# Patient Record
Sex: Female | Born: 1980 | Race: White | Hispanic: No | Marital: Married | Smoking: Never smoker
Health system: Southern US, Community
[De-identification: ages and names within clinical notes are randomized; demographics above are authoritative.]

## PROBLEM LIST (undated history)

## (undated) DIAGNOSIS — T7840XA Allergy, unspecified, initial encounter: Secondary | ICD-10-CM

## (undated) HISTORY — DX: Allergy, unspecified, initial encounter: T78.40XA

---

## 2018-09-25 DIAGNOSIS — Z6827 Body mass index (BMI) 27.0-27.9, adult: Secondary | ICD-10-CM | POA: Diagnosis not present

## 2018-09-25 DIAGNOSIS — Z0000 Encounter for general adult medical examination without abnormal findings: Secondary | ICD-10-CM | POA: Diagnosis not present

## 2018-09-25 DIAGNOSIS — Z13220 Encounter for screening for lipoid disorders: Secondary | ICD-10-CM | POA: Diagnosis not present

## 2018-09-25 DIAGNOSIS — J302 Other seasonal allergic rhinitis: Secondary | ICD-10-CM | POA: Diagnosis not present

## 2018-09-25 DIAGNOSIS — Z23 Encounter for immunization: Secondary | ICD-10-CM | POA: Diagnosis not present

## 2019-05-09 DIAGNOSIS — H5213 Myopia, bilateral: Secondary | ICD-10-CM | POA: Diagnosis not present

## 2019-10-24 DIAGNOSIS — Z13220 Encounter for screening for lipoid disorders: Secondary | ICD-10-CM | POA: Diagnosis not present

## 2019-10-24 DIAGNOSIS — J302 Other seasonal allergic rhinitis: Secondary | ICD-10-CM | POA: Diagnosis not present

## 2019-10-24 DIAGNOSIS — Z0000 Encounter for general adult medical examination without abnormal findings: Secondary | ICD-10-CM | POA: Diagnosis not present

## 2019-10-24 DIAGNOSIS — Z23 Encounter for immunization: Secondary | ICD-10-CM | POA: Diagnosis not present

## 2019-10-24 DIAGNOSIS — Z136 Encounter for screening for cardiovascular disorders: Secondary | ICD-10-CM | POA: Diagnosis not present

## 2020-01-30 DIAGNOSIS — L2084 Intrinsic (allergic) eczema: Secondary | ICD-10-CM | POA: Diagnosis not present

## 2020-01-30 DIAGNOSIS — L309 Dermatitis, unspecified: Secondary | ICD-10-CM | POA: Diagnosis not present

## 2020-01-30 DIAGNOSIS — L811 Chloasma: Secondary | ICD-10-CM | POA: Diagnosis not present

## 2020-02-05 DIAGNOSIS — R5383 Other fatigue: Secondary | ICD-10-CM | POA: Diagnosis not present

## 2020-02-05 DIAGNOSIS — K2970 Gastritis, unspecified, without bleeding: Secondary | ICD-10-CM | POA: Diagnosis not present

## 2020-02-29 DIAGNOSIS — Z23 Encounter for immunization: Secondary | ICD-10-CM | POA: Diagnosis not present

## 2020-03-26 DIAGNOSIS — Z23 Encounter for immunization: Secondary | ICD-10-CM | POA: Diagnosis not present

## 2020-04-22 DIAGNOSIS — M545 Low back pain: Secondary | ICD-10-CM | POA: Diagnosis not present

## 2020-04-25 ENCOUNTER — Ambulatory Visit (INDEPENDENT_AMBULATORY_CARE_PROVIDER_SITE_OTHER): Payer: BLUE CROSS/BLUE SHIELD

## 2020-04-25 ENCOUNTER — Other Ambulatory Visit: Payer: Self-pay

## 2020-04-25 ENCOUNTER — Ambulatory Visit: Payer: BLUE CROSS/BLUE SHIELD | Admitting: Sports Medicine

## 2020-04-25 ENCOUNTER — Encounter: Payer: Self-pay | Admitting: Sports Medicine

## 2020-04-25 DIAGNOSIS — M545 Low back pain, unspecified: Secondary | ICD-10-CM | POA: Insufficient documentation

## 2020-04-25 DIAGNOSIS — G8929 Other chronic pain: Secondary | ICD-10-CM

## 2020-04-25 MED ORDER — CELECOXIB 200 MG PO CAPS: ORAL_CAPSULE | ORAL | 2 refills | Status: AC

## 2020-04-25 NOTE — Progress Notes (Signed)
    Procedures performed today:    None.  Independent interpretation of notes and tests performed by another provider:   None.  Brief History, Exam, Impression, and Recommendations:    Chronic low back pain This is a very pleasant 39 year old female, she has 6 children. She has had chronic history of low back pain, right worse than left localized at the sacroiliac joint, worse with standing. Her exam is for the most part benign, only minimal tenderness over the right sacroiliac joint. We are going to start with aggressive formal physical therapy, Celebrex, x-rays. I like to see her back in about 6 weeks, we will do SI joint injections if no better, we discussed other treatments including RFA and fusion.    ___________________________________________ Ihor Austin. Benjamin Stain, M.D., ABFM., CAQSM. Primary Care and Sports Medicine [facility]  Adjunct Instructor of Family Medicine  [employer]

## 2020-04-25 NOTE — Assessment & Plan Note (Signed)
This is a very pleasant 39 year old female, she has 6 children. She has had chronic history of low back pain, right worse than left localized at the sacroiliac joint, worse with standing. Her exam is for the most part benign, only minimal tenderness over the right sacroiliac joint. We are going to start with aggressive formal physical therapy, Celebrex, x-rays. I like to see her back in about 6 weeks, we will do SI joint injections if no better, we discussed other treatments including RFA and fusion.

## 2020-05-12 DIAGNOSIS — M461 Sacroiliitis, not elsewhere classified: Secondary | ICD-10-CM | POA: Diagnosis not present

## 2020-05-12 DIAGNOSIS — M6281 Muscle weakness (generalized): Secondary | ICD-10-CM | POA: Diagnosis not present

## 2020-05-22 DIAGNOSIS — M461 Sacroiliitis, not elsewhere classified: Secondary | ICD-10-CM | POA: Diagnosis not present

## 2020-05-22 DIAGNOSIS — M6281 Muscle weakness (generalized): Secondary | ICD-10-CM | POA: Diagnosis not present

## 2020-05-26 DIAGNOSIS — M6281 Muscle weakness (generalized): Secondary | ICD-10-CM | POA: Diagnosis not present

## 2020-05-26 DIAGNOSIS — M461 Sacroiliitis, not elsewhere classified: Secondary | ICD-10-CM | POA: Diagnosis not present

## 2020-05-28 DIAGNOSIS — H5213 Myopia, bilateral: Secondary | ICD-10-CM | POA: Diagnosis not present

## 2020-05-29 DIAGNOSIS — M461 Sacroiliitis, not elsewhere classified: Secondary | ICD-10-CM | POA: Diagnosis not present

## 2020-05-29 DIAGNOSIS — M6281 Muscle weakness (generalized): Secondary | ICD-10-CM | POA: Diagnosis not present

## 2020-06-04 DIAGNOSIS — M6281 Muscle weakness (generalized): Secondary | ICD-10-CM | POA: Diagnosis not present

## 2020-06-04 DIAGNOSIS — M461 Sacroiliitis, not elsewhere classified: Secondary | ICD-10-CM | POA: Diagnosis not present

## 2020-06-09 ENCOUNTER — Other Ambulatory Visit: Payer: Self-pay

## 2020-06-09 ENCOUNTER — Ambulatory Visit (INDEPENDENT_AMBULATORY_CARE_PROVIDER_SITE_OTHER): Payer: BLUE CROSS/BLUE SHIELD | Admitting: Sports Medicine

## 2020-06-09 ENCOUNTER — Encounter: Payer: Self-pay | Admitting: Sports Medicine

## 2020-06-09 DIAGNOSIS — M545 Low back pain: Secondary | ICD-10-CM | POA: Diagnosis not present

## 2020-06-09 DIAGNOSIS — M461 Sacroiliitis, not elsewhere classified: Secondary | ICD-10-CM | POA: Diagnosis not present

## 2020-06-09 DIAGNOSIS — G8929 Other chronic pain: Secondary | ICD-10-CM

## 2020-06-09 DIAGNOSIS — M6281 Muscle weakness (generalized): Secondary | ICD-10-CM | POA: Diagnosis not present

## 2020-06-09 NOTE — Progress Notes (Signed)
    Procedures performed today:    None.  Independent interpretation of notes and tests performed by another provider:   None.  Brief History, Exam, Impression, and Recommendations:    Chronic low back pain This is a very pleasant 39-year-old female, multiparous, has a history of chronic low back pain, right worse than left localized at the SI joint, at the last visit we started Celebrex and added aggressive formal physical therapy. She returns today doing significantly better and continuing to improve. We are going to set an open-ended follow-up,    ___________________________________________ Ihor Austin. Benjamin Stain, M.D., ABFM., CAQSM. Primary Care and Sports Medicine [facility]  Adjunct Instructor of Family Medicine  [employer]

## 2020-06-09 NOTE — Assessment & Plan Note (Signed)
This is a very pleasant 39-year-old female, multiparous, has a history of chronic low back pain, right worse than left localized at the SI joint, at the last visit we started Celebrex and added aggressive formal physical therapy. She returns today doing significantly better and continuing to improve. We are going to set an open-ended follow-up,

## 2020-06-16 DIAGNOSIS — M6281 Muscle weakness (generalized): Secondary | ICD-10-CM | POA: Diagnosis not present

## 2020-06-16 DIAGNOSIS — M461 Sacroiliitis, not elsewhere classified: Secondary | ICD-10-CM | POA: Diagnosis not present

## 2020-06-25 DIAGNOSIS — M6281 Muscle weakness (generalized): Secondary | ICD-10-CM | POA: Diagnosis not present

## 2020-06-25 DIAGNOSIS — M461 Sacroiliitis, not elsewhere classified: Secondary | ICD-10-CM | POA: Diagnosis not present

## 2020-07-04 DIAGNOSIS — M6281 Muscle weakness (generalized): Secondary | ICD-10-CM | POA: Diagnosis not present

## 2020-07-04 DIAGNOSIS — M461 Sacroiliitis, not elsewhere classified: Secondary | ICD-10-CM | POA: Diagnosis not present

## 2020-07-09 DIAGNOSIS — M461 Sacroiliitis, not elsewhere classified: Secondary | ICD-10-CM | POA: Diagnosis not present

## 2020-07-09 DIAGNOSIS — M6281 Muscle weakness (generalized): Secondary | ICD-10-CM | POA: Diagnosis not present

## 2020-07-17 DIAGNOSIS — M461 Sacroiliitis, not elsewhere classified: Secondary | ICD-10-CM | POA: Diagnosis not present

## 2020-07-17 DIAGNOSIS — M6281 Muscle weakness (generalized): Secondary | ICD-10-CM | POA: Diagnosis not present

## 2020-07-20 DIAGNOSIS — Z20828 Contact with and (suspected) exposure to other viral communicable diseases: Secondary | ICD-10-CM | POA: Diagnosis not present

## 2020-09-30 DIAGNOSIS — Z1231 Encounter for screening mammogram for malignant neoplasm of breast: Secondary | ICD-10-CM | POA: Diagnosis not present

## 2020-11-12 DIAGNOSIS — Z131 Encounter for screening for diabetes mellitus: Secondary | ICD-10-CM | POA: Diagnosis not present

## 2020-11-12 DIAGNOSIS — Z124 Encounter for screening for malignant neoplasm of cervix: Secondary | ICD-10-CM | POA: Diagnosis not present

## 2020-11-12 DIAGNOSIS — Z0000 Encounter for general adult medical examination without abnormal findings: Secondary | ICD-10-CM | POA: Diagnosis not present

## 2020-11-12 DIAGNOSIS — H93A2 Pulsatile tinnitus, left ear: Secondary | ICD-10-CM | POA: Diagnosis not present

## 2020-11-12 DIAGNOSIS — Z13220 Encounter for screening for lipoid disorders: Secondary | ICD-10-CM | POA: Diagnosis not present

## 2020-11-12 DIAGNOSIS — R7989 Other specified abnormal findings of blood chemistry: Secondary | ICD-10-CM | POA: Diagnosis not present

## 2020-11-12 DIAGNOSIS — F331 Major depressive disorder, recurrent, moderate: Secondary | ICD-10-CM | POA: Diagnosis not present

## 2020-11-12 DIAGNOSIS — Z23 Encounter for immunization: Secondary | ICD-10-CM | POA: Diagnosis not present

## 2020-11-27 DIAGNOSIS — Z23 Encounter for immunization: Secondary | ICD-10-CM | POA: Diagnosis not present

## 2020-12-08 DIAGNOSIS — H903 Sensorineural hearing loss, bilateral: Secondary | ICD-10-CM | POA: Diagnosis not present

## 2020-12-08 DIAGNOSIS — H93A2 Pulsatile tinnitus, left ear: Secondary | ICD-10-CM | POA: Diagnosis not present

## 2021-01-08 DIAGNOSIS — F331 Major depressive disorder, recurrent, moderate: Secondary | ICD-10-CM | POA: Diagnosis not present

## 2021-01-08 DIAGNOSIS — N3941 Urge incontinence: Secondary | ICD-10-CM | POA: Diagnosis not present

## 2021-01-26 DIAGNOSIS — M25562 Pain in left knee: Secondary | ICD-10-CM | POA: Diagnosis not present

## 2021-01-26 DIAGNOSIS — M25462 Effusion, left knee: Secondary | ICD-10-CM | POA: Diagnosis not present

## 2021-01-26 DIAGNOSIS — M25461 Effusion, right knee: Secondary | ICD-10-CM | POA: Diagnosis not present

## 2021-01-26 DIAGNOSIS — M25561 Pain in right knee: Secondary | ICD-10-CM | POA: Diagnosis not present

## 2021-01-29 DIAGNOSIS — S83412A Sprain of medial collateral ligament of left knee, initial encounter: Secondary | ICD-10-CM | POA: Diagnosis not present

## 2021-01-29 DIAGNOSIS — S83522A Sprain of posterior cruciate ligament of left knee, initial encounter: Secondary | ICD-10-CM | POA: Diagnosis not present

## 2021-01-29 DIAGNOSIS — S8002XA Contusion of left knee, initial encounter: Secondary | ICD-10-CM | POA: Diagnosis not present

## 2021-01-29 DIAGNOSIS — S83512A Sprain of anterior cruciate ligament of left knee, initial encounter: Secondary | ICD-10-CM | POA: Diagnosis not present

## 2021-02-03 DIAGNOSIS — S83512D Sprain of anterior cruciate ligament of left knee, subsequent encounter: Secondary | ICD-10-CM | POA: Diagnosis not present

## 2021-02-03 DIAGNOSIS — S83242D Other tear of medial meniscus, current injury, left knee, subsequent encounter: Secondary | ICD-10-CM | POA: Diagnosis not present

## 2021-02-03 DIAGNOSIS — S83412D Sprain of medial collateral ligament of left knee, subsequent encounter: Secondary | ICD-10-CM | POA: Diagnosis not present

## 2021-02-04 DIAGNOSIS — M6281 Muscle weakness (generalized): Secondary | ICD-10-CM | POA: Diagnosis not present

## 2021-02-04 DIAGNOSIS — M25562 Pain in left knee: Secondary | ICD-10-CM | POA: Diagnosis not present

## 2021-02-04 DIAGNOSIS — R262 Difficulty in walking, not elsewhere classified: Secondary | ICD-10-CM | POA: Diagnosis not present

## 2021-02-06 DIAGNOSIS — R262 Difficulty in walking, not elsewhere classified: Secondary | ICD-10-CM | POA: Diagnosis not present

## 2021-02-06 DIAGNOSIS — M25562 Pain in left knee: Secondary | ICD-10-CM | POA: Diagnosis not present

## 2021-02-06 DIAGNOSIS — M6281 Muscle weakness (generalized): Secondary | ICD-10-CM | POA: Diagnosis not present

## 2021-02-09 DIAGNOSIS — M25562 Pain in left knee: Secondary | ICD-10-CM | POA: Diagnosis not present

## 2021-02-09 DIAGNOSIS — R262 Difficulty in walking, not elsewhere classified: Secondary | ICD-10-CM | POA: Diagnosis not present

## 2021-02-09 DIAGNOSIS — M6281 Muscle weakness (generalized): Secondary | ICD-10-CM | POA: Diagnosis not present

## 2021-02-10 DIAGNOSIS — S8992XS Unspecified injury of left lower leg, sequela: Secondary | ICD-10-CM | POA: Diagnosis not present

## 2021-02-10 DIAGNOSIS — S83512A Sprain of anterior cruciate ligament of left knee, initial encounter: Secondary | ICD-10-CM | POA: Diagnosis not present

## 2021-02-10 DIAGNOSIS — S83412A Sprain of medial collateral ligament of left knee, initial encounter: Secondary | ICD-10-CM | POA: Diagnosis not present

## 2021-02-10 DIAGNOSIS — S83522A Sprain of posterior cruciate ligament of left knee, initial encounter: Secondary | ICD-10-CM | POA: Diagnosis not present

## 2021-02-18 DIAGNOSIS — M25562 Pain in left knee: Secondary | ICD-10-CM | POA: Diagnosis not present

## 2021-02-18 DIAGNOSIS — M6281 Muscle weakness (generalized): Secondary | ICD-10-CM | POA: Diagnosis not present

## 2021-02-18 DIAGNOSIS — R262 Difficulty in walking, not elsewhere classified: Secondary | ICD-10-CM | POA: Diagnosis not present

## 2021-02-24 DIAGNOSIS — M25562 Pain in left knee: Secondary | ICD-10-CM | POA: Diagnosis not present

## 2021-02-24 DIAGNOSIS — M6281 Muscle weakness (generalized): Secondary | ICD-10-CM | POA: Diagnosis not present

## 2021-02-24 DIAGNOSIS — R262 Difficulty in walking, not elsewhere classified: Secondary | ICD-10-CM | POA: Diagnosis not present

## 2021-03-04 DIAGNOSIS — M25562 Pain in left knee: Secondary | ICD-10-CM | POA: Diagnosis not present

## 2021-03-04 DIAGNOSIS — M6281 Muscle weakness (generalized): Secondary | ICD-10-CM | POA: Diagnosis not present

## 2021-03-04 DIAGNOSIS — R262 Difficulty in walking, not elsewhere classified: Secondary | ICD-10-CM | POA: Diagnosis not present

## 2021-03-13 DIAGNOSIS — M6281 Muscle weakness (generalized): Secondary | ICD-10-CM | POA: Diagnosis not present

## 2021-03-13 DIAGNOSIS — M25562 Pain in left knee: Secondary | ICD-10-CM | POA: Diagnosis not present

## 2021-03-13 DIAGNOSIS — R262 Difficulty in walking, not elsewhere classified: Secondary | ICD-10-CM | POA: Diagnosis not present

## 2021-03-16 DIAGNOSIS — M94262 Chondromalacia, left knee: Secondary | ICD-10-CM | POA: Diagnosis not present

## 2021-03-16 DIAGNOSIS — S83522A Sprain of posterior cruciate ligament of left knee, initial encounter: Secondary | ICD-10-CM | POA: Diagnosis not present

## 2021-03-16 DIAGNOSIS — S8992XD Unspecified injury of left lower leg, subsequent encounter: Secondary | ICD-10-CM | POA: Diagnosis not present

## 2021-03-16 DIAGNOSIS — G8918 Other acute postprocedural pain: Secondary | ICD-10-CM | POA: Diagnosis not present

## 2021-03-16 DIAGNOSIS — S83412A Sprain of medial collateral ligament of left knee, initial encounter: Secondary | ICD-10-CM | POA: Diagnosis not present

## 2021-03-16 DIAGNOSIS — S83512A Sprain of anterior cruciate ligament of left knee, initial encounter: Secondary | ICD-10-CM | POA: Diagnosis not present

## 2021-03-18 DIAGNOSIS — R262 Difficulty in walking, not elsewhere classified: Secondary | ICD-10-CM | POA: Diagnosis not present

## 2021-03-18 DIAGNOSIS — M6281 Muscle weakness (generalized): Secondary | ICD-10-CM | POA: Diagnosis not present

## 2021-03-18 DIAGNOSIS — M25562 Pain in left knee: Secondary | ICD-10-CM | POA: Diagnosis not present

## 2021-03-24 DIAGNOSIS — M25562 Pain in left knee: Secondary | ICD-10-CM | POA: Diagnosis not present

## 2021-03-24 DIAGNOSIS — R262 Difficulty in walking, not elsewhere classified: Secondary | ICD-10-CM | POA: Diagnosis not present

## 2021-03-24 DIAGNOSIS — M6281 Muscle weakness (generalized): Secondary | ICD-10-CM | POA: Diagnosis not present

## 2021-03-31 DIAGNOSIS — R262 Difficulty in walking, not elsewhere classified: Secondary | ICD-10-CM | POA: Diagnosis not present

## 2021-03-31 DIAGNOSIS — M6281 Muscle weakness (generalized): Secondary | ICD-10-CM | POA: Diagnosis not present

## 2021-03-31 DIAGNOSIS — M25562 Pain in left knee: Secondary | ICD-10-CM | POA: Diagnosis not present

## 2021-04-06 DIAGNOSIS — R262 Difficulty in walking, not elsewhere classified: Secondary | ICD-10-CM | POA: Diagnosis not present

## 2021-04-06 DIAGNOSIS — M25562 Pain in left knee: Secondary | ICD-10-CM | POA: Diagnosis not present

## 2021-04-06 DIAGNOSIS — M6281 Muscle weakness (generalized): Secondary | ICD-10-CM | POA: Diagnosis not present

## 2021-04-09 DIAGNOSIS — M6281 Muscle weakness (generalized): Secondary | ICD-10-CM | POA: Diagnosis not present

## 2021-04-09 DIAGNOSIS — M25562 Pain in left knee: Secondary | ICD-10-CM | POA: Diagnosis not present

## 2021-04-09 DIAGNOSIS — R262 Difficulty in walking, not elsewhere classified: Secondary | ICD-10-CM | POA: Diagnosis not present

## 2021-04-13 DIAGNOSIS — R262 Difficulty in walking, not elsewhere classified: Secondary | ICD-10-CM | POA: Diagnosis not present

## 2021-04-13 DIAGNOSIS — M25562 Pain in left knee: Secondary | ICD-10-CM | POA: Diagnosis not present

## 2021-04-13 DIAGNOSIS — M6281 Muscle weakness (generalized): Secondary | ICD-10-CM | POA: Diagnosis not present

## 2021-04-15 DIAGNOSIS — M25562 Pain in left knee: Secondary | ICD-10-CM | POA: Diagnosis not present

## 2021-04-15 DIAGNOSIS — R262 Difficulty in walking, not elsewhere classified: Secondary | ICD-10-CM | POA: Diagnosis not present

## 2021-04-15 DIAGNOSIS — M6281 Muscle weakness (generalized): Secondary | ICD-10-CM | POA: Diagnosis not present

## 2021-04-17 DIAGNOSIS — M25462 Effusion, left knee: Secondary | ICD-10-CM | POA: Diagnosis not present

## 2021-04-17 DIAGNOSIS — Z9889 Other specified postprocedural states: Secondary | ICD-10-CM | POA: Diagnosis not present

## 2021-04-20 DIAGNOSIS — M6281 Muscle weakness (generalized): Secondary | ICD-10-CM | POA: Diagnosis not present

## 2021-04-20 DIAGNOSIS — M25562 Pain in left knee: Secondary | ICD-10-CM | POA: Diagnosis not present

## 2021-04-20 DIAGNOSIS — R262 Difficulty in walking, not elsewhere classified: Secondary | ICD-10-CM | POA: Diagnosis not present

## 2021-04-22 DIAGNOSIS — M6281 Muscle weakness (generalized): Secondary | ICD-10-CM | POA: Diagnosis not present

## 2021-04-22 DIAGNOSIS — M25562 Pain in left knee: Secondary | ICD-10-CM | POA: Diagnosis not present

## 2021-04-22 DIAGNOSIS — R262 Difficulty in walking, not elsewhere classified: Secondary | ICD-10-CM | POA: Diagnosis not present

## 2021-04-27 DIAGNOSIS — R262 Difficulty in walking, not elsewhere classified: Secondary | ICD-10-CM | POA: Diagnosis not present

## 2021-04-27 DIAGNOSIS — M25562 Pain in left knee: Secondary | ICD-10-CM | POA: Diagnosis not present

## 2021-04-27 DIAGNOSIS — M6281 Muscle weakness (generalized): Secondary | ICD-10-CM | POA: Diagnosis not present

## 2021-04-29 DIAGNOSIS — M6281 Muscle weakness (generalized): Secondary | ICD-10-CM | POA: Diagnosis not present

## 2021-04-29 DIAGNOSIS — R262 Difficulty in walking, not elsewhere classified: Secondary | ICD-10-CM | POA: Diagnosis not present

## 2021-04-29 DIAGNOSIS — M25562 Pain in left knee: Secondary | ICD-10-CM | POA: Diagnosis not present

## 2021-05-04 DIAGNOSIS — R262 Difficulty in walking, not elsewhere classified: Secondary | ICD-10-CM | POA: Diagnosis not present

## 2021-05-04 DIAGNOSIS — M25562 Pain in left knee: Secondary | ICD-10-CM | POA: Diagnosis not present

## 2021-05-04 DIAGNOSIS — M6281 Muscle weakness (generalized): Secondary | ICD-10-CM | POA: Diagnosis not present

## 2021-05-06 DIAGNOSIS — M6281 Muscle weakness (generalized): Secondary | ICD-10-CM | POA: Diagnosis not present

## 2021-05-06 DIAGNOSIS — M25562 Pain in left knee: Secondary | ICD-10-CM | POA: Diagnosis not present

## 2021-05-06 DIAGNOSIS — R262 Difficulty in walking, not elsewhere classified: Secondary | ICD-10-CM | POA: Diagnosis not present

## 2021-05-11 DIAGNOSIS — M6281 Muscle weakness (generalized): Secondary | ICD-10-CM | POA: Diagnosis not present

## 2021-05-11 DIAGNOSIS — M25562 Pain in left knee: Secondary | ICD-10-CM | POA: Diagnosis not present

## 2021-05-11 DIAGNOSIS — R262 Difficulty in walking, not elsewhere classified: Secondary | ICD-10-CM | POA: Diagnosis not present

## 2021-05-13 DIAGNOSIS — M25562 Pain in left knee: Secondary | ICD-10-CM | POA: Diagnosis not present

## 2021-05-13 DIAGNOSIS — R262 Difficulty in walking, not elsewhere classified: Secondary | ICD-10-CM | POA: Diagnosis not present

## 2021-05-13 DIAGNOSIS — M6281 Muscle weakness (generalized): Secondary | ICD-10-CM | POA: Diagnosis not present

## 2021-05-15 DIAGNOSIS — M238X2 Other internal derangements of left knee: Secondary | ICD-10-CM | POA: Diagnosis not present

## 2021-05-15 DIAGNOSIS — M23612 Other spontaneous disruption of anterior cruciate ligament of left knee: Secondary | ICD-10-CM | POA: Diagnosis not present

## 2021-05-15 DIAGNOSIS — M23622 Other spontaneous disruption of posterior cruciate ligament of left knee: Secondary | ICD-10-CM | POA: Diagnosis not present

## 2021-05-15 DIAGNOSIS — S8992XA Unspecified injury of left lower leg, initial encounter: Secondary | ICD-10-CM | POA: Diagnosis not present

## 2021-05-19 DIAGNOSIS — R262 Difficulty in walking, not elsewhere classified: Secondary | ICD-10-CM | POA: Diagnosis not present

## 2021-05-19 DIAGNOSIS — M25562 Pain in left knee: Secondary | ICD-10-CM | POA: Diagnosis not present

## 2021-05-19 DIAGNOSIS — M6281 Muscle weakness (generalized): Secondary | ICD-10-CM | POA: Diagnosis not present

## 2021-05-22 DIAGNOSIS — M6281 Muscle weakness (generalized): Secondary | ICD-10-CM | POA: Diagnosis not present

## 2021-05-22 DIAGNOSIS — R262 Difficulty in walking, not elsewhere classified: Secondary | ICD-10-CM | POA: Diagnosis not present

## 2021-05-22 DIAGNOSIS — M25562 Pain in left knee: Secondary | ICD-10-CM | POA: Diagnosis not present

## 2021-05-25 DIAGNOSIS — R262 Difficulty in walking, not elsewhere classified: Secondary | ICD-10-CM | POA: Diagnosis not present

## 2021-05-25 DIAGNOSIS — M25562 Pain in left knee: Secondary | ICD-10-CM | POA: Diagnosis not present

## 2021-05-25 DIAGNOSIS — M6281 Muscle weakness (generalized): Secondary | ICD-10-CM | POA: Diagnosis not present

## 2021-05-28 DIAGNOSIS — M25562 Pain in left knee: Secondary | ICD-10-CM | POA: Diagnosis not present

## 2021-05-28 DIAGNOSIS — M6281 Muscle weakness (generalized): Secondary | ICD-10-CM | POA: Diagnosis not present

## 2021-05-28 DIAGNOSIS — R262 Difficulty in walking, not elsewhere classified: Secondary | ICD-10-CM | POA: Diagnosis not present

## 2021-06-01 DIAGNOSIS — M6281 Muscle weakness (generalized): Secondary | ICD-10-CM | POA: Diagnosis not present

## 2021-06-01 DIAGNOSIS — M25562 Pain in left knee: Secondary | ICD-10-CM | POA: Diagnosis not present

## 2021-06-01 DIAGNOSIS — R262 Difficulty in walking, not elsewhere classified: Secondary | ICD-10-CM | POA: Diagnosis not present

## 2021-06-03 DIAGNOSIS — R262 Difficulty in walking, not elsewhere classified: Secondary | ICD-10-CM | POA: Diagnosis not present

## 2021-06-03 DIAGNOSIS — M6281 Muscle weakness (generalized): Secondary | ICD-10-CM | POA: Diagnosis not present

## 2021-06-03 DIAGNOSIS — M25562 Pain in left knee: Secondary | ICD-10-CM | POA: Diagnosis not present

## 2021-06-16 DIAGNOSIS — S83522S Sprain of posterior cruciate ligament of left knee, sequela: Secondary | ICD-10-CM | POA: Diagnosis not present

## 2021-06-16 DIAGNOSIS — M24662 Ankylosis, left knee: Secondary | ICD-10-CM | POA: Diagnosis not present

## 2021-06-30 DIAGNOSIS — H5213 Myopia, bilateral: Secondary | ICD-10-CM | POA: Diagnosis not present

## 2021-07-07 DIAGNOSIS — M24662 Ankylosis, left knee: Secondary | ICD-10-CM | POA: Diagnosis not present

## 2021-07-07 DIAGNOSIS — M25562 Pain in left knee: Secondary | ICD-10-CM | POA: Diagnosis not present

## 2021-07-08 DIAGNOSIS — F3281 Premenstrual dysphoric disorder: Secondary | ICD-10-CM | POA: Diagnosis not present

## 2021-07-08 DIAGNOSIS — R140 Abdominal distension (gaseous): Secondary | ICD-10-CM | POA: Diagnosis not present

## 2021-07-21 DIAGNOSIS — Z9889 Other specified postprocedural states: Secondary | ICD-10-CM | POA: Diagnosis not present

## 2021-07-21 DIAGNOSIS — M24662 Ankylosis, left knee: Secondary | ICD-10-CM | POA: Diagnosis not present

## 2021-08-20 DIAGNOSIS — S83412D Sprain of medial collateral ligament of left knee, subsequent encounter: Secondary | ICD-10-CM | POA: Diagnosis not present

## 2021-10-23 DIAGNOSIS — H9203 Otalgia, bilateral: Secondary | ICD-10-CM | POA: Diagnosis not present

## 2021-10-30 DIAGNOSIS — J019 Acute sinusitis, unspecified: Secondary | ICD-10-CM | POA: Diagnosis not present

## 2021-10-30 DIAGNOSIS — H6982 Other specified disorders of Eustachian tube, left ear: Secondary | ICD-10-CM | POA: Diagnosis not present

## 2021-10-30 DIAGNOSIS — B9689 Other specified bacterial agents as the cause of diseases classified elsewhere: Secondary | ICD-10-CM | POA: Diagnosis not present

## 2021-11-06 DIAGNOSIS — H93293 Other abnormal auditory perceptions, bilateral: Secondary | ICD-10-CM | POA: Diagnosis not present

## 2021-11-06 DIAGNOSIS — H8110 Benign paroxysmal vertigo, unspecified ear: Secondary | ICD-10-CM | POA: Diagnosis not present

## 2021-11-06 DIAGNOSIS — H93A2 Pulsatile tinnitus, left ear: Secondary | ICD-10-CM | POA: Diagnosis not present

## 2021-11-16 DIAGNOSIS — H93A9 Pulsatile tinnitus, unspecified ear: Secondary | ICD-10-CM | POA: Diagnosis not present

## 2021-11-16 DIAGNOSIS — H93A2 Pulsatile tinnitus, left ear: Secondary | ICD-10-CM | POA: Diagnosis not present

## 2021-11-18 DIAGNOSIS — Z1231 Encounter for screening mammogram for malignant neoplasm of breast: Secondary | ICD-10-CM | POA: Diagnosis not present

## 2022-02-21 IMAGING — DX DG LUMBAR SPINE COMPLETE 4+V
5 series · 5 of 5 positions shown · non-contrast
Comparison: None.

CLINICAL DATA: Chronic low back and right leg pain.

EXAM:
LUMBAR SPINE - COMPLETE 4+ VIEW

[l-spine ap]
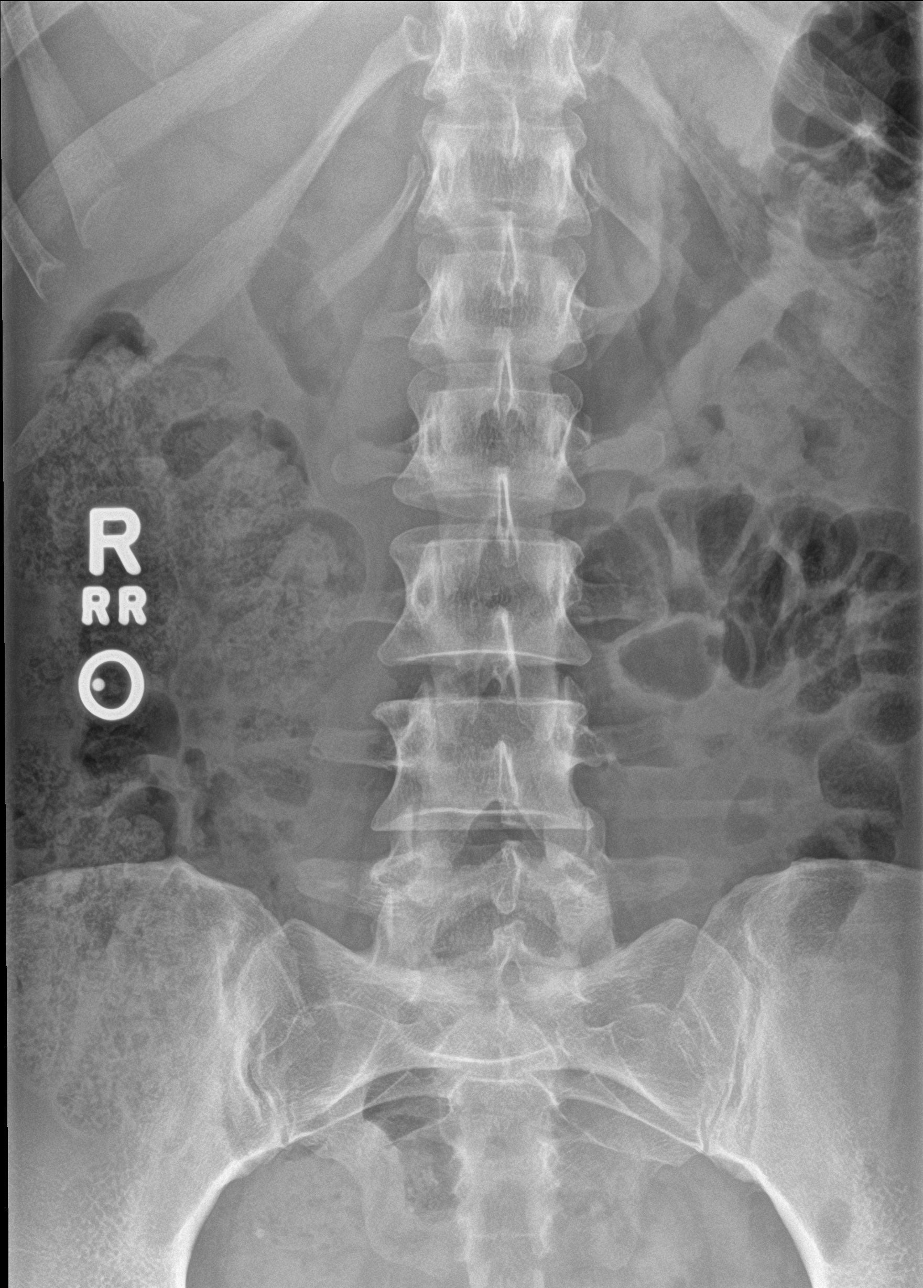

[l-spine obl (1 of 2)]
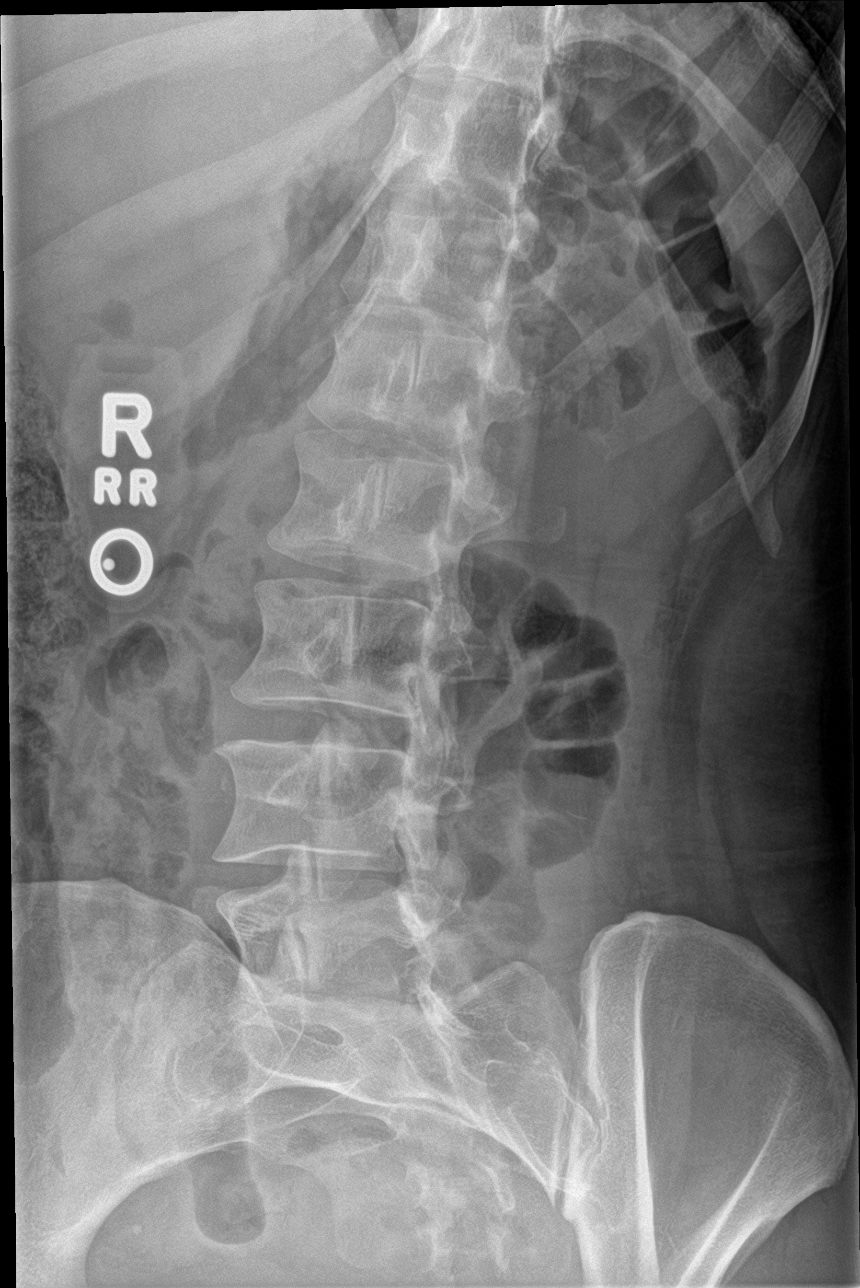

[l-spine obl (2 of 2)]
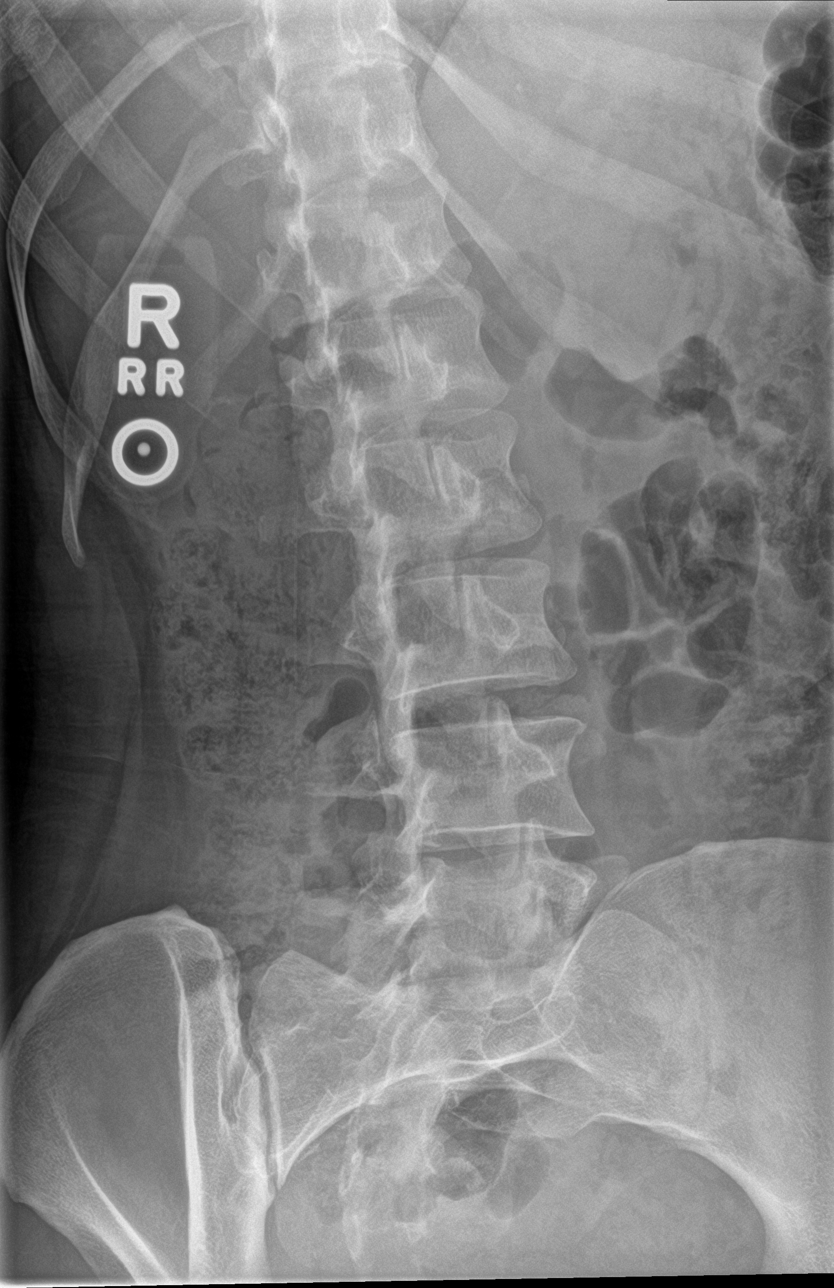

[l-spine lat]
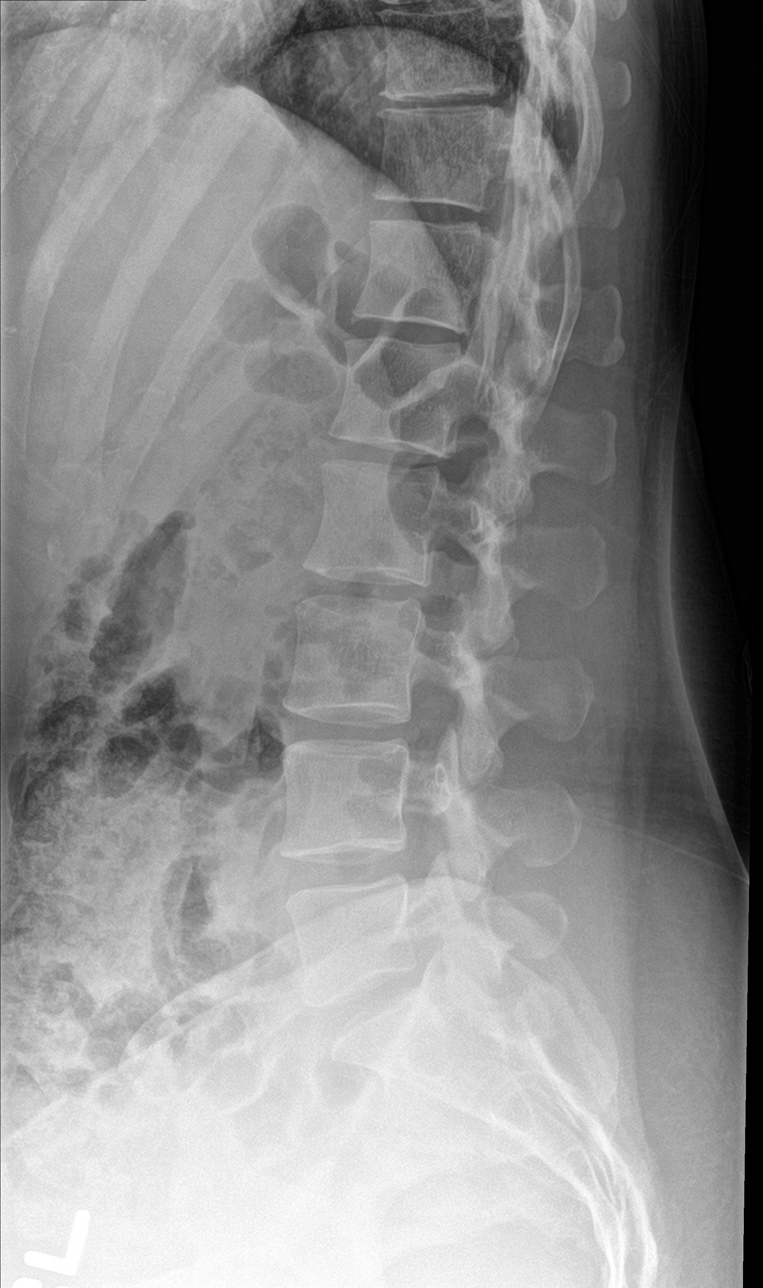

[l-spine spot]
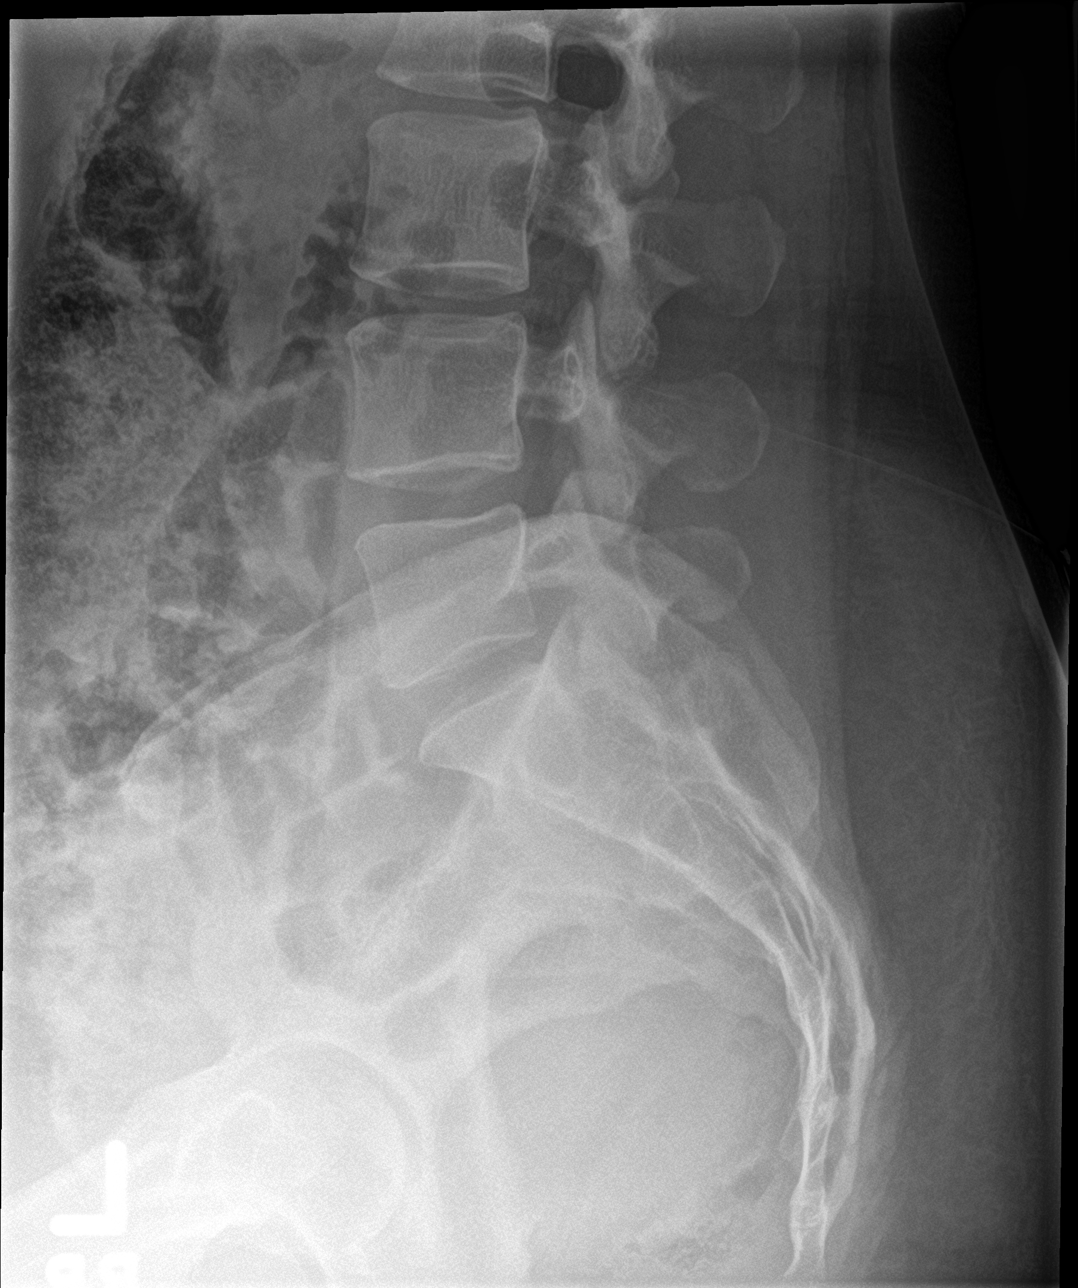

[5 of 5 positions shown; findings below may reference images not displayed]

FINDINGS: There is no evidence of lumbar spine fracture. Alignment is normal.
Intervertebral disc spaces are maintained.
IMPRESSION: Negative.
